# Patient Record
Sex: Male | Born: 1950 | Race: Asian | Hispanic: No | Marital: Married | Smoking: Never smoker
Health system: Southern US, Community
[De-identification: ages and names within clinical notes are randomized; demographics above are authoritative.]

## PROBLEM LIST (undated history)

## (undated) DIAGNOSIS — I10 Essential (primary) hypertension: Secondary | ICD-10-CM

## (undated) DIAGNOSIS — E119 Type 2 diabetes mellitus without complications: Secondary | ICD-10-CM

## (undated) DIAGNOSIS — E7800 Pure hypercholesterolemia, unspecified: Secondary | ICD-10-CM

---

## 2007-09-09 ENCOUNTER — Inpatient Hospital Stay (HOSPITAL_BASED_OUTPATIENT_CLINIC_OR_DEPARTMENT_OTHER): Admission: RE | Admit: 2007-09-09 | Discharge: 2007-09-09 | Payer: Self-pay | Admitting: Cardiology

## 2010-08-23 NOTE — Cardiovascular Report (Signed)
NAMEASIF, MUCHOW                ACCOUNT NO.:  [id number]   MEDICAL RECORD NO.:  [id number]          PATIENT TYPE:  OIB   LOCATION:  1962                         FACILITY:  [facility]   PHYSICIAN:  Mohan N. Sharyn Lull, M.D. DATE OF BIRTH:  1950/10/27   DATE OF PROCEDURE:  09/09/2007  DATE OF DISCHARGE:  09/09/2007                            CARDIAC CATHETERIZATION   PROCEDURE:  Left cardiac catheterization with selective left and right  coronary angiography, left ventriculography via right groin using  Judkins technique.   INDICATIONS FOR PROCEDURE:  Mr. Thedore Mins is a 60 year old Asian male with  past medical history significant for hypertension, non-insulin-dependent  diabetes mellitus, and hypercholesteremia.  Complains of retrosternal  chest pain and pressure with minimal exertion, relieved with rest.  Denies any nausea, vomiting, or diaphoresis.  Denies shortness of  breath.  Denies palpitation, lightheadedness, or syncope.  Denies PND,  orthopnea, or leg swelling.  Denies rest or nocturnal angina.  Denies  any slurred speech or weakness.  Denies claudication or pain in legs.  Denies relation of chest pain to sleep, breathing, or movement.  Denies  any cardiac workup in the past.  EKG done in the office showed mild ST-T  wave changes in the inferior leads with regular sinus rhythm.   PAST SURGICAL HISTORY:  None.   ALLERGIES:  None.   HOME MEDICATIONS:  He is on;  1. Lisinopril 40 mg p.o. daily.  2. Lipitor 40 mg p.o. daily.  3. Metformin 1000 mg p.o. b.i.d.  4. Enteric-coated aspirin 81 mg p.o. daily.   SOCIAL HISTORY:  He is married, two children.  No history of smoking.  Drinks whiskey occasionally, on weekends socially.  Works for [employer].   FAMILY HISTORY:  Father died of accidental death.  He was hypertensive  at the age of 19.  Mother died of MI at the age of 46.  One sister is  diabetic and one brother is diabetic.   PHYSICAL EXAMINATION:  GENERAL:  He  was alert, awake, and oriented x3,  in no acute distress.  VITAL SIGNS:  Blood pressure was 130/82 and pulse was 62 regular.  HEENT:  Conjunctivae was pink.  NECK:  Supple.  No JVD or no bruit.  LUNGS:  Clear to auscultation without rhonchi or rales.  CARDIOVASCULAR:  S1, S2 was normal.  Soft systolic murmur.  ABDOMEN:  Soft.  Bowel sounds are present and nontender.  EXTREMITIES:  There is no clubbing, cyanosis, or edema.   LABORATORY DATA:  Hemoglobin was 14.9, hematocrit 43.9, and white count  was 6.2.  Potassium was 4.5, BUN 19, creatinine 0.97, glucose 129, total  cholesterol 128, LDL 59, HDL 55, and hemoglobin A1c was 5.9.  EKG done  in the office showed normal sinus rhythm with minor ST and T-wave  changes in the inferior leads.   IMPRESSION:  1. Onset angina with minor EKG changes.  2. Rule out coronary insufficiency.  3. Hypertension.  4. Non-insulin-dependent diabetes mellitus.  5. Hypercholesteremia.  6. Positive family history of coronary artery disease.   Discussed with the patient regarding noninvasive  stress testing versus  left catheterization, its risks and benefits, i.e., death, MI, stroke,  need for emergency CABG, local vascular complications, etc., and  consented for the procedure.   PROCEDURE:  After obtaining the informed consent, the patient was  brought to the catheterization lab and was placed on the fluoroscopy  table.  The right groin was prepped and draped in usual fashion.  A 2%  Xylocaine was used for local anesthesia in the right groin, with the  help of thin-wall needle, a 4-French arterial sheath was placed.  The  sheath was aspirated and flushed.  Next, a 6-French left Judkins  catheter was advanced over the wire under fluoroscopic guidance up to  the ascending aorta.  Wire was pulled out, the catheter was aspirated  and connected to the manifold.  Catheter was further advanced and  engaged into left coronary ostium.  Multiple views of the left  system  were taken.  Next, the catheter was disengaged and was pulled out over  the wire and was replaced with 6-French right Judkins catheter, which  was advanced over the wire under fluoroscopic guidance up to the  ascending aorta.  Wire was pulled out, the catheter was aspirated and  connected to the manifold.  Catheter was further advanced and engaged  into right coronary ostium.  Multiple views of the right system were  taken.  Next, the catheter was disengaged and was pulled out over the  wire and was replaced with 4-French pigtail catheter, which was advanced  over the wire under fluoroscopic guidance up to the ascending aorta.  The wire was pulled out, the catheter was aspirated and connected to the  manifold.  Catheter was further advanced across aortic valve into the  LV.  LV pressures were recorded.  Next, LV graft was done in 30-degree  RAO position.  Post angiographic pressures were recorded from LV and  then pullback pressures were recorded from the aorta.  There was no  gradient across the aortic valve.  Next, the pigtail catheter was pulled  out over the wire.  Sheaths were aspirated and flushed.   FINDINGS:  LV showed good LV systolic function and mild LVH.  EF of 55%  to 60%, left main was patent.  LAD has 5% to 10% proximal stenosis.  Diagonal I to diagonal III were very small, which were patent.  Ramus  was very small, which was patent.  Left circumflex was small, which was  patent.  RCA has 5% to 10% mid stenosis.  PDA and PLV branches were  small, which were patent.  The patient tolerated the procedure well.  There were no complications.  The patient was transferred to recovery  room in stable condition.      Eduardo Osier. Sharyn Lull, M.D.  Electronically Signed     [facility]/MEDQ  D:  09/09/2007  T:  09/09/2007  Job:  [id number]   cc:   Coletta Memos, M.D.

## 2019-01-21 ENCOUNTER — Other Ambulatory Visit: Payer: Self-pay | Admitting: Family Medicine

## 2019-01-21 DIAGNOSIS — R109 Unspecified abdominal pain: Secondary | ICD-10-CM

## 2019-01-27 ENCOUNTER — Ambulatory Visit (HOSPITAL_COMMUNITY)
Admission: RE | Admit: 2019-01-27 | Discharge: 2019-01-27 | Disposition: A | Discharge status: Home / Self Care | Payer: 59 | Source: Ambulatory Visit | Attending: Family Medicine | Admitting: Family Medicine

## 2019-01-27 ENCOUNTER — Other Ambulatory Visit: Payer: Self-pay

## 2019-01-27 DIAGNOSIS — R109 Unspecified abdominal pain: Secondary | ICD-10-CM | POA: Insufficient documentation

## 2019-02-10 ENCOUNTER — Other Ambulatory Visit: Payer: Self-pay | Admitting: Family Medicine

## 2019-02-10 ENCOUNTER — Other Ambulatory Visit (HOSPITAL_COMMUNITY): Payer: Self-pay | Admitting: Family Medicine

## 2019-02-10 DIAGNOSIS — R109 Unspecified abdominal pain: Secondary | ICD-10-CM

## 2019-02-20 ENCOUNTER — Ambulatory Visit (HOSPITAL_COMMUNITY): Payer: 59

## 2019-02-28 ENCOUNTER — Other Ambulatory Visit: Payer: Self-pay

## 2019-02-28 ENCOUNTER — Ambulatory Visit (HOSPITAL_COMMUNITY)
Admission: RE | Admit: 2019-02-28 | Discharge: 2019-02-28 | Disposition: A | Discharge status: Home / Self Care | Payer: 59 | Source: Ambulatory Visit | Attending: Family Medicine | Admitting: Family Medicine

## 2019-02-28 DIAGNOSIS — R109 Unspecified abdominal pain: Secondary | ICD-10-CM | POA: Insufficient documentation

## 2019-02-28 MED ORDER — TECHNETIUM TC 99M MEBROFENIN IV KIT
5.2300 | PACK | Freq: Once | INTRAVENOUS | Status: AC | PRN
  Administered 2019-02-28: 07:00:00 5.23 via INTRAVENOUS

## 2020-02-17 ENCOUNTER — Ambulatory Visit
Admission: EM | Admit: 2020-02-17 | Discharge: 2020-02-17 | Disposition: A | Discharge status: Home / Self Care | Payer: PRIVATE HEALTH INSURANCE

## 2020-02-17 DIAGNOSIS — S0990XA Unspecified injury of head, initial encounter: Secondary | ICD-10-CM | POA: Diagnosis not present

## 2020-02-17 DIAGNOSIS — M25511 Pain in right shoulder: Secondary | ICD-10-CM | POA: Diagnosis not present

## 2020-02-17 DIAGNOSIS — M25521 Pain in right elbow: Secondary | ICD-10-CM | POA: Diagnosis not present

## 2020-02-17 HISTORY — DX: Essential (primary) hypertension: I10

## 2020-02-17 HISTORY — DX: Pure hypercholesterolemia, unspecified: E78.00

## 2020-02-17 HISTORY — DX: Type 2 diabetes mellitus without complications: E11.9

## 2020-02-17 NOTE — ED Provider Notes (Signed)
[facility]    CSN: [id number] Arrival date & time: 02/17/20  1659      History   Chief Complaint Chief Complaint  Patient presents with  . Head Injury    HPI Walter Hawkins is a 69 y.o. male  Presenting for evaluation after a metal pipe weighing approximately 60-80 pounds broke off and hit him on the right side of his head causing him to fall onto his right shoulder.  States fall was witnessed: No loss of consciousness, vomiting, change in vision or hearing.  Denies dizziness, irritability, memory issues.  States that he sat down at work for 15 minutes and felt better.  Has not taken thing for this.  Denies anticoagulant or blood thinner use.  Past Medical History:  Diagnosis Date  . Diabetes mellitus without complication (HCC)   . High cholesterol   . Hypertension     There are no problems to display for this patient.   History reviewed. No pertinent surgical history.     Home Medications    Prior to Admission medications   Medication Sig Start Date End Date Taking? Authorizing Provider  lisinopril (ZESTRIL) 20 MG tablet Take 20 mg by mouth daily.   Yes [provider]  metFORMIN (GLUCOPHAGE) 850 MG tablet Take 850 mg by mouth 2 (two) times daily with a meal.   Yes [provider]  rosuvastatin (CRESTOR) 40 MG tablet Take 40 mg by mouth daily.   Yes [provider]    Family History History reviewed. No pertinent family history.  Social History Social History   Tobacco Use  . Smoking status: Never Smoker  . Smokeless tobacco: Never Used  Substance Use Topics  . Alcohol use: Yes  . Drug use: Not Currently     Allergies   Patient has no known allergies.   Review of Systems Review of Systems  Constitutional: Negative for fatigue and fever.  Respiratory: Negative for cough and shortness of breath.   Cardiovascular: Negative for chest pain and palpitations.  Gastrointestinal: Negative for abdominal pain, diarrhea  and vomiting.  Musculoskeletal: Negative for arthralgias and myalgias.       R shoulder & elbow pain  Skin: Negative for rash and wound.  Neurological: Positive for headaches. Negative for dizziness, tremors, seizures, syncope, facial asymmetry, speech difficulty, weakness, light-headedness and numbness.  Psychiatric/Behavioral: Negative for agitation, behavioral problems and confusion.  All other systems reviewed and are negative.    Physical Exam Triage Vital Signs ED Triage Vitals  Enc Vitals Group     BP 02/17/20 1825 (!) 179/84     Pulse Rate 02/17/20 1825 80     Resp 02/17/20 1825 18     Temp 02/17/20 1825 98.1 F (36.7 C)     Temp Source 02/17/20 1825 Oral     SpO2 02/17/20 1825 97 %     Weight --      Height --      Head Circumference --      Peak Flow --      Pain Score 02/17/20 1836 7     Pain Loc --      Pain Edu? --      Excl. in GC? --    No data found.  Updated Vital Signs BP (!) 179/84 (BP Location: Left Arm)   Pulse 80   Temp 98.1 F (36.7 C) (Oral)   Resp 18   SpO2 97%   Visual Acuity Right Eye Distance:   Left Eye Distance:  Bilateral Distance:    Right Eye Near:   Left Eye Near:    Bilateral Near:     Physical Exam Constitutional:      General: He is not in acute distress. HENT:     Head: Normocephalic and atraumatic.     Comments: No not to right side of head (area of impact)    Right Ear: Tympanic membrane, ear canal and external ear normal.     Left Ear: Tympanic membrane, ear canal and external ear normal.     Mouth/Throat:     Mouth: Mucous membranes are moist.     Pharynx: Oropharynx is clear.  Eyes:     General: No scleral icterus.    Extraocular Movements: Extraocular movements intact.     Conjunctiva/sclera: Conjunctivae normal.     Pupils: Pupils are equal, round, and reactive to light.  Cardiovascular:     Rate and Rhythm: Normal rate.  Pulmonary:     Effort: Pulmonary effort is normal. No respiratory distress.      Breath sounds: No wheezing.  Musculoskeletal:        General: Tenderness present. No swelling or deformity.     Cervical back: Normal range of motion. No rigidity or tenderness.     Comments: Mild right deltoid tenderness that spares bony processes of shoulder.  No scapular tenderness, bony elbow tenderness or swelling, bruising.  NVI  Lymphadenopathy:     Cervical: No cervical adenopathy.  Skin:    Capillary Refill: Capillary refill takes less than 2 seconds.     Coloration: Skin is not jaundiced.     Findings: No bruising or rash.  Neurological:     General: No focal deficit present.     Mental Status: He is alert and oriented to person, place, and time.     Cranial Nerves: Cranial nerves are intact. No cranial nerve deficit.     Sensory: Sensation is intact. No sensory deficit.     Motor: Motor function is intact. No weakness.     Coordination: Coordination is intact. Coordination normal.     Gait: Gait is intact. Gait normal.     Deep Tendon Reflexes: Reflexes normal.  Psychiatric:        Mood and Affect: Mood normal.        Behavior: Behavior normal.      UC Treatments / Results  Labs (all labs ordered are listed, but only abnormal results are displayed) Labs Reviewed - No data to display  EKG   Radiology No results found.  Procedures Procedures (including critical care time)  Medications Ordered in UC Medications - No data to display  Initial Impression / Assessment and Plan / UC Course  I have reviewed the triage vital signs and the nursing notes.  Pertinent labs & imaging results that were available during my care of the patient were reviewed by me and considered in my medical decision making (see chart for details).     Patient appears well in office.  Reassuring musculoskeletal exam and no neurocognitive deficit.  Not on anticoagulant or blood thinner.  Denies loss of consciousness.  Will treat supportively as below.  ER return precautions discussed, pt  verbalized understanding and is agreeable to plan. Final Clinical Impressions(s) / UC Diagnoses   Final diagnoses:  Acute pain of right shoulder  Right elbow pain  Head injury, initial encounter     Discharge Instructions     RICE: rest, ice, compression, elevation as needed for pain.  Pain medication:  350 mg-1000 mg of Tylenol (acetaminophen) and/or 200 mg - 800 mg of Advil (ibuprofen, Motrin) every 8 hours as needed.  May alternate between the two throughout the day as they are generally safe to take together.  DO NOT exceed more than 3000 mg of Tylenol or 3200 mg of ibuprofen in a 24 hour period as this could damage your stomach, kidneys, liver, or increase your bleeding risk.  Important to follow up with specialist(s) below for further evaluation/management if your symptoms persist or worsen.    ED Prescriptions    None     PDMP not reviewed this encounter.   Hall-Potvin, [place], [place] 02/17/20 2032

## 2020-02-17 NOTE — Discharge Instructions (Addendum)

## 2020-02-17 NOTE — ED Triage Notes (Signed)
Pt states at work a metal rod (60-80lbs) broke off and fell hitting him on rt side/top of head/rt shoulder/rt arm. States it knocked him to the ground and not sure what happen for 2-3secs. Pt c/o headache, rt shoulder and rt elbow pain . Pt denies blurred vision or nausea at this time. Pt a/ox4. No distress noted.

## 2020-02-23 ENCOUNTER — Ambulatory Visit: Payer: Self-pay

## 2020-02-23 ENCOUNTER — Other Ambulatory Visit: Payer: Self-pay | Admitting: Occupational Medicine

## 2020-02-23 ENCOUNTER — Other Ambulatory Visit: Payer: Self-pay

## 2020-02-23 ENCOUNTER — Ambulatory Visit: Admission: EM | Admit: 2020-02-23 | Discharge: 2020-02-23 | Discharge status: Against Medical Advice | Payer: Self-pay

## 2020-02-23 DIAGNOSIS — M25511 Pain in right shoulder: Secondary | ICD-10-CM

## 2020-10-04 IMAGING — US US ABDOMEN LIMITED
1 series · 14 of 25 positions shown · non-contrast
Comparison: None.

CLINICAL DATA: Right upper quadrant discomfort for 1 year.

EXAM:
ULTRASOUND ABDOMEN LIMITED RIGHT UPPER QUADRANT

[Series 1: us abdomen limited · 14 of 42 slices shown]
[im 1/42]
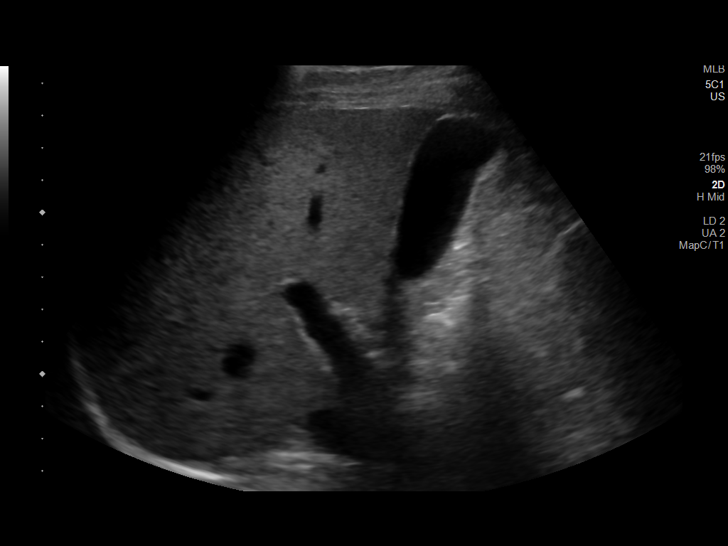
[im 4/42]
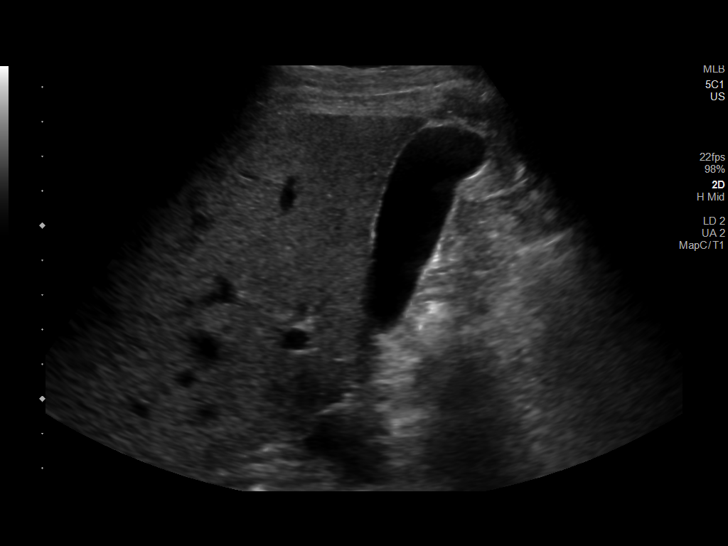
[im 7/42]
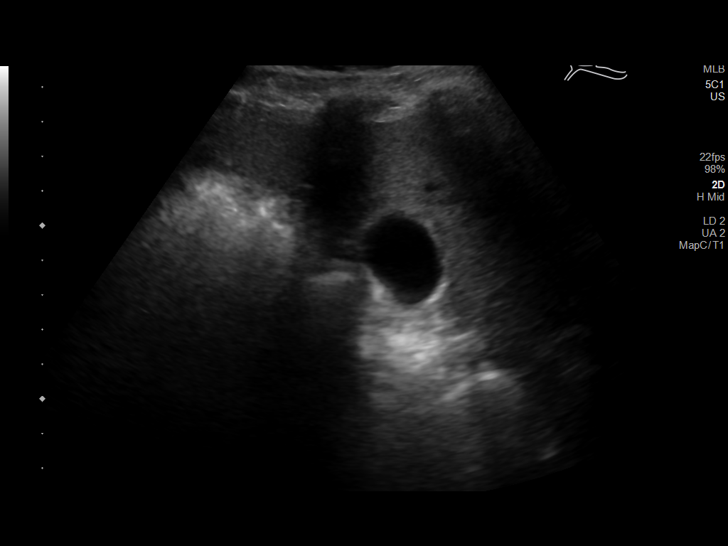
[im 11/42]
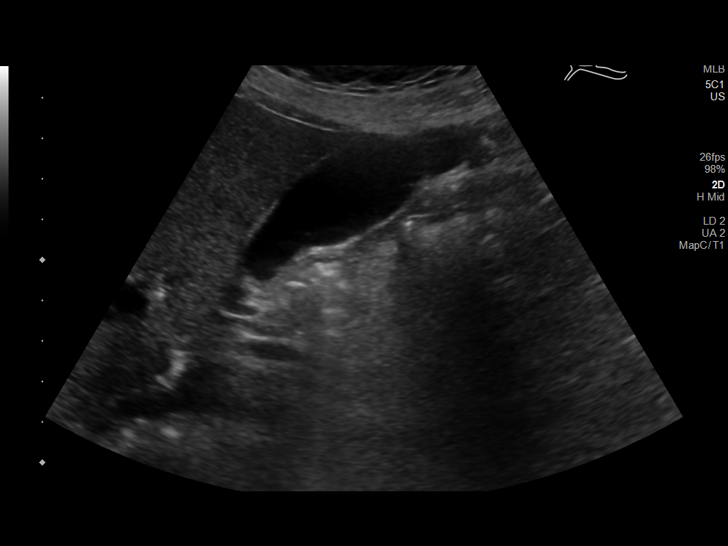
[im 14/42]
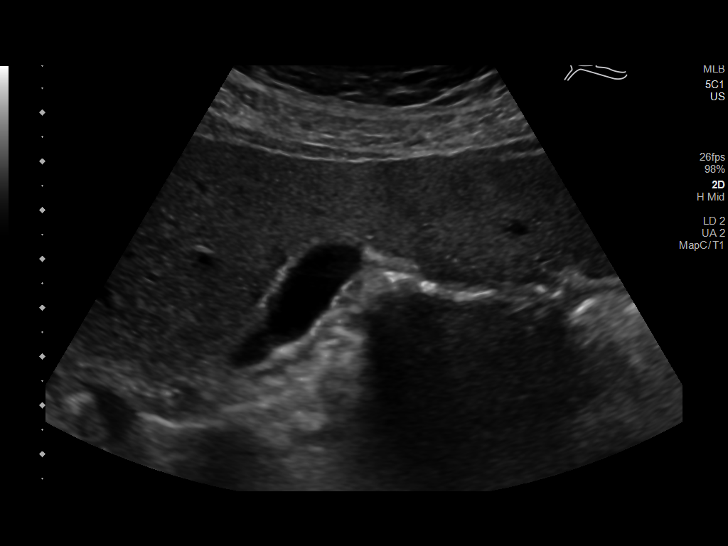
[im 16/42]
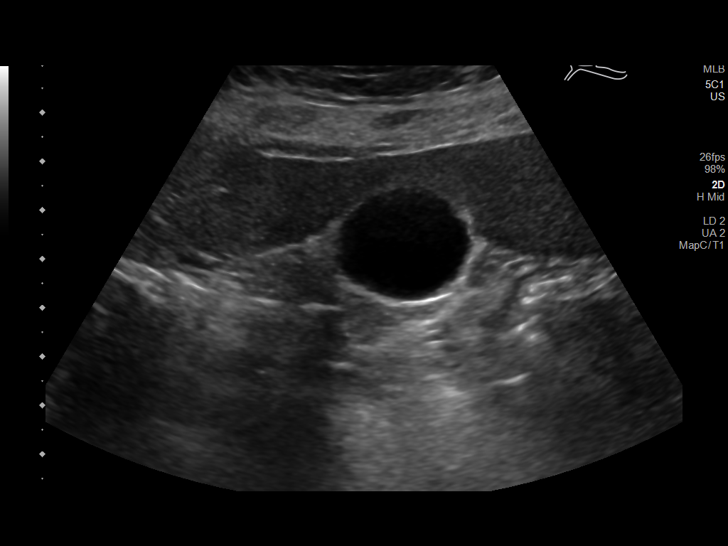
[im 19/42]
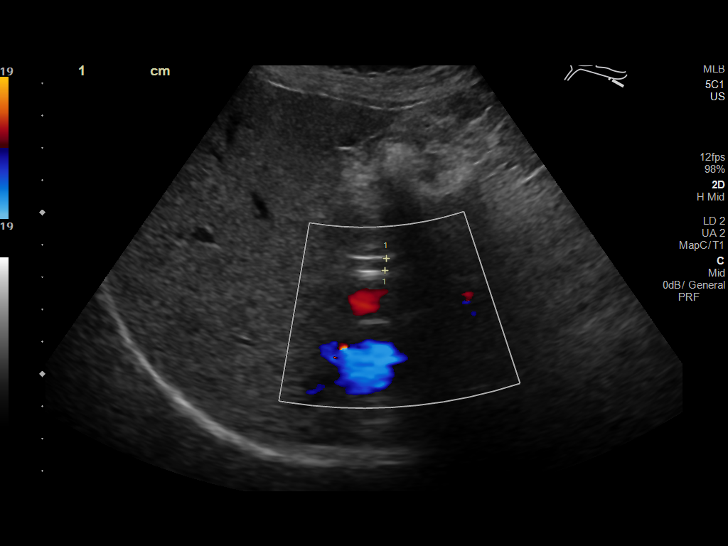
[im 23/42]
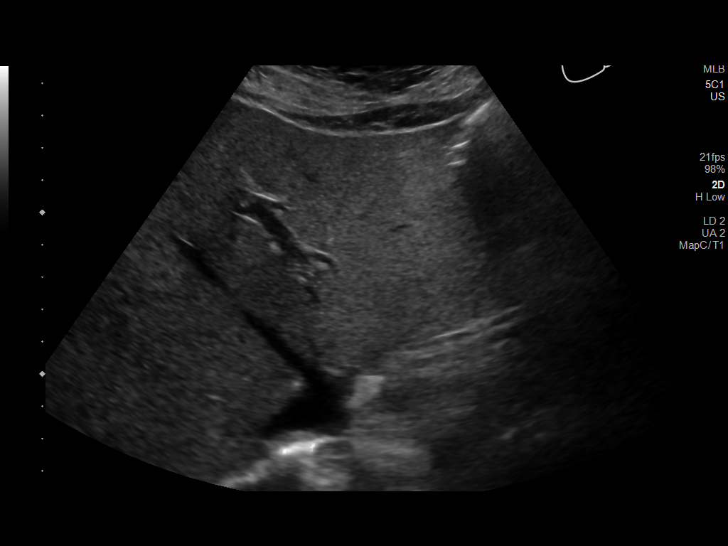
[im 26/42]
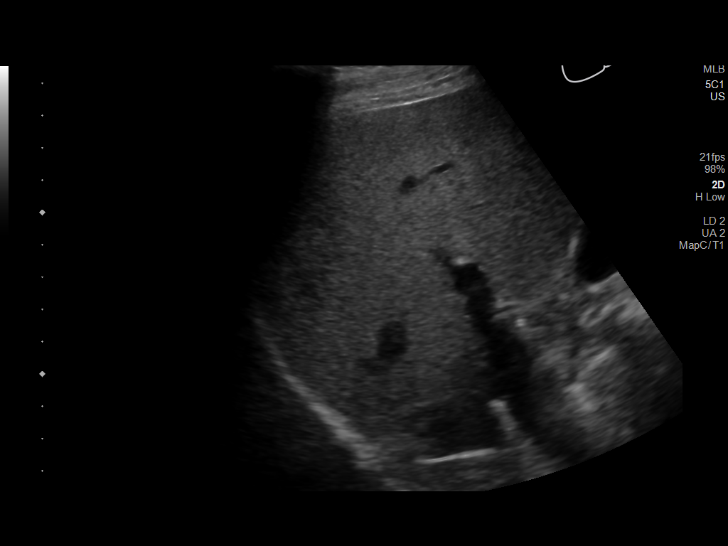
[im 28/42]
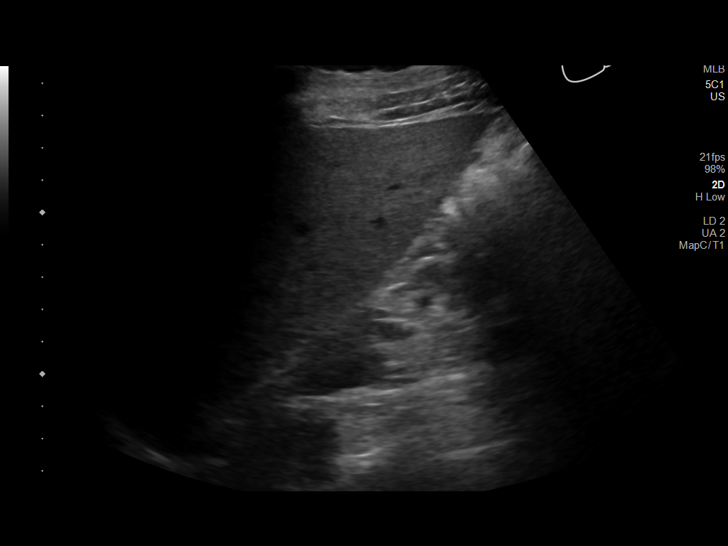
[im 31/42]
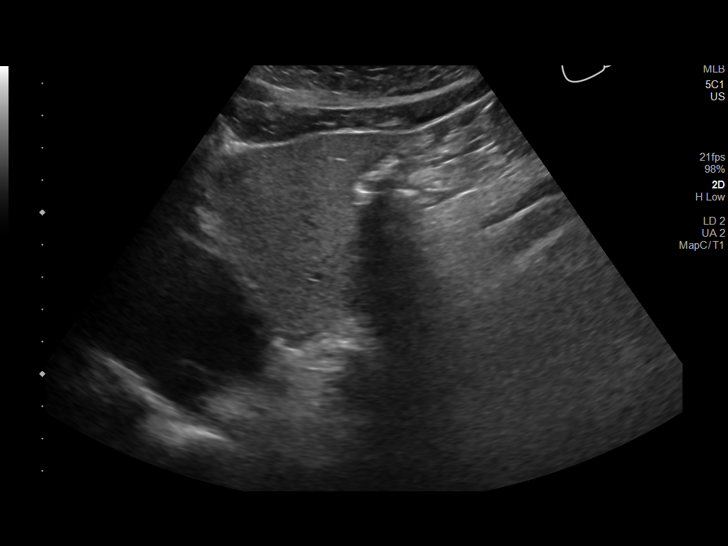
[im 35/42]
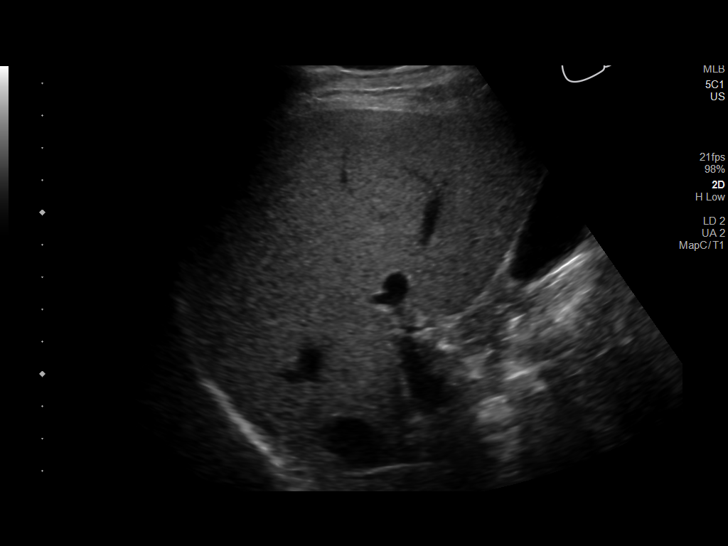
[im 38/42]
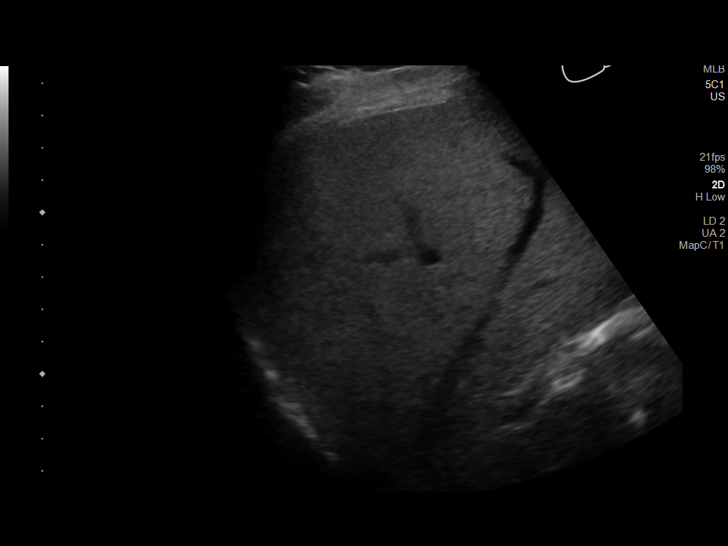
[im 42/42]
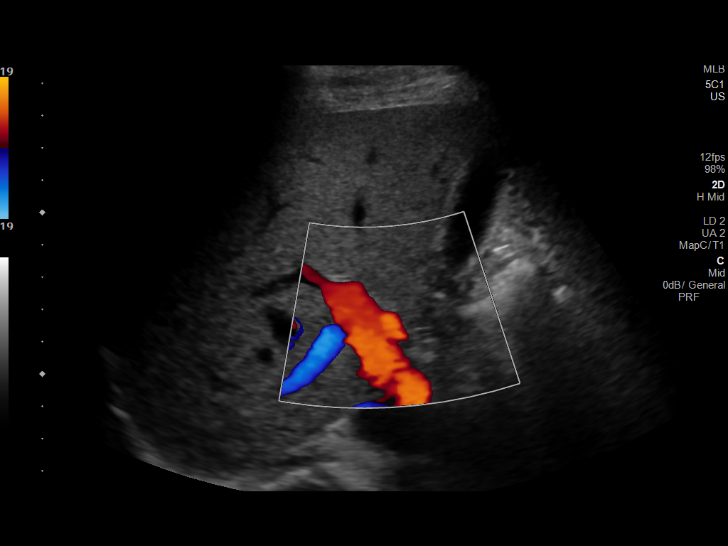

[14 of 25 positions shown; findings below may reference images not displayed]

FINDINGS: Gallbladder:

No gallstones or wall thickening visualized. No sonographic Murphy
sign noted by sonographer.

Common bile duct:

Diameter: 0.4 cm

Liver:

No focal lesion identified. The liver echogenicity is diffusely
increased. Portal vein is patent on color Doppler imaging with
normal direction of blood flow towards the liver.

Other: None.
IMPRESSION: Diffusely increased liver echogenicity as can be seen with hepatic
steatosis.

## 2020-11-05 IMAGING — NM NM HEPATO W/GB/PHARM/[PERSON_NAME]
2 series · 12 of 12 positions shown · non-contrast
Comparison: None

CLINICAL DATA: Abdominal pain for 1 year

EXAM:
NUCLEAR MEDICINE HEPATOBILIARY IMAGING WITH GALLBLADDER EF
TECHNIQUE: Sequential images of the abdomen were obtained [DATE] minutes
following intravenous administration of radiopharmaceutical. After
oral ingestion of Ensure, gallbladder ejection fraction was
determined. At 60 min, normal ejection fraction is greater than 33%.
RADIOPHARMACEUTICALS:  5.23 mCi 3c-RRm  Choletec IV

[he hepatobiliary · 4.52mm/px · 6 of 60 frames shown (1 of 2)]
[frame 6/60]
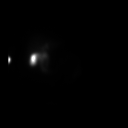
[frame 16/60]
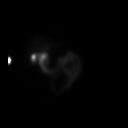
[frame 26/60]
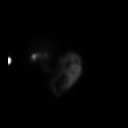
[frame 36/60]
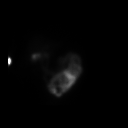
[frame 46/60]
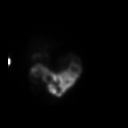
[frame 56/60]
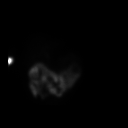

[he hepatobiliary · 4.52mm/px · 6 of 60 frames shown (2 of 2)]
[frame 6/60]
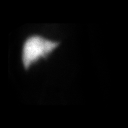
[frame 16/60]
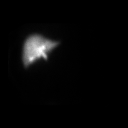
[frame 26/60]
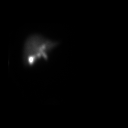
[frame 36/60]
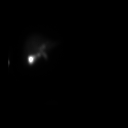
[frame 46/60]
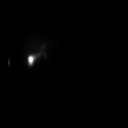
[frame 56/60]
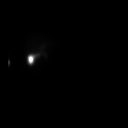

[12 of 12 positions shown; findings below may reference images not displayed]

FINDINGS: Normal tracer extraction from bloodstream indicating normal
hepatocellular function.

Normal excretion of tracer into biliary tree.

Gallbladder visualized at 17 min.

Small bowel not visualized until following fatty meal stimulation

No hepatic retention of tracer.

Subjectively normal emptying of tracer from gallbladder following
fatty meal stimulation.

Calculated gallbladder ejection fraction is 99%, normal.

Patient reported no symptoms following Ensure ingestion.

Normal gallbladder ejection fraction following Ensure ingestion is
greater than 33% at 1 hour.
IMPRESSION: Patent biliary tree with normal gallbladder ejection fraction of 99%
following fatty meal stimulation.

## 2021-01-07 ENCOUNTER — Other Ambulatory Visit: Payer: Self-pay

## 2021-01-07 ENCOUNTER — Emergency Department (HOSPITAL_COMMUNITY)
Admission: EM | Admit: 2021-01-07 | Discharge: 2021-01-08 | Disposition: A | Discharge status: Home / Self Care | Payer: Medicare (Managed Care) | Attending: Emergency Medicine | Admitting: Emergency Medicine

## 2021-01-07 ENCOUNTER — Emergency Department (HOSPITAL_COMMUNITY): Payer: Medicare (Managed Care)

## 2021-01-07 DIAGNOSIS — E119 Type 2 diabetes mellitus without complications: Secondary | ICD-10-CM | POA: Diagnosis not present

## 2021-01-07 DIAGNOSIS — R030 Elevated blood-pressure reading, without diagnosis of hypertension: Secondary | ICD-10-CM | POA: Diagnosis present

## 2021-01-07 DIAGNOSIS — R9431 Abnormal electrocardiogram [ECG] [EKG]: Secondary | ICD-10-CM | POA: Insufficient documentation

## 2021-01-07 DIAGNOSIS — Z79899 Other long term (current) drug therapy: Secondary | ICD-10-CM | POA: Diagnosis not present

## 2021-01-07 DIAGNOSIS — I10 Essential (primary) hypertension: Secondary | ICD-10-CM | POA: Diagnosis not present

## 2021-01-07 DIAGNOSIS — R52 Pain, unspecified: Secondary | ICD-10-CM

## 2021-01-07 DIAGNOSIS — Z7984 Long term (current) use of oral hypoglycemic drugs: Secondary | ICD-10-CM | POA: Diagnosis not present

## 2021-01-07 LAB — CBC
HCT: 40.1 % (ref 39.0–52.0)
Hemoglobin: 13.4 g/dL (ref 13.0–17.0)
MCH: 30.8 pg (ref 26.0–34.0)
MCHC: 33.4 g/dL (ref 30.0–36.0)
MCV: 92.2 fL (ref 80.0–100.0)
Platelets: 270 10*3/uL (ref 150–400)
RBC: 4.35 MIL/uL (ref 4.22–5.81)
RDW: 12.9 % (ref 11.5–15.5)
WBC: 7.5 10*3/uL (ref 4.0–10.5)
nRBC: 0 % (ref 0.0–0.2)

## 2021-01-07 LAB — BASIC METABOLIC PANEL
Anion gap: 9 (ref 5–15)
BUN: 17 mg/dL (ref 8–23)
CO2: 24 mmol/L (ref 22–32)
Calcium: 9.2 mg/dL (ref 8.9–10.3)
Chloride: 102 mmol/L (ref 98–111)
Creatinine, Ser: 1.02 mg/dL (ref 0.61–1.24)
GFR, Estimated: >60 mL/min (ref >60)
Glucose, Bld: 213 mg/dL — ABNORMAL HIGH (ref 70–99)
Potassium: 4.3 mmol/L (ref 3.5–5.1)
Sodium: 135 mmol/L (ref 135–145)

## 2021-01-07 LAB — TROPONIN I (HIGH SENSITIVITY)
Troponin I (High Sensitivity): 7 ng/L (ref <18)
Troponin I (High Sensitivity): 6 ng/L (ref <18)

## 2021-01-07 NOTE — ED Provider Notes (Signed)
Emergency Medicine Provider Triage Evaluation Note  Walter Hawkins , a 70 y.o. male  was evaluated in triage.  Pt complains of abnormal ekg. Pt seen by pcp for regular checkup and had and ekg which showed ischemic changes. The patient is completely asymptomatic  Review of Systems  Positive: Abnormal ekg Negative: Chest pain, sob  Physical Exam  BP (!) 200/69 (BP Location: Right Arm)   Pulse 75   Temp 98.8 F (37.1 C) (Oral)   Resp 16   SpO2 100%  Gen:   Awake, no distress   Resp:  Normal effort  MSK:   Moves extremities without difficulty   Medical Decision Making  Medically screening exam initiated at 6:56 PM.  Appropriate orders placed.  Walter Hawkins was informed that the remainder of the evaluation will be completed by another provider, this initial triage assessment does not replace that evaluation, and the importance of remaining in the ED until their evaluation is complete.     Walter Meres, PA-C 01/07/21 1856    Walter Norfolk, DO 01/07/21 2032

## 2021-01-07 NOTE — ED Triage Notes (Signed)
Pt here POV d/t pt having abnormal EKG from PCP. Pt denies CP,SOB, no nausea or vomiting. Hypertensive in triage 200/69.

## 2021-01-08 NOTE — ED Provider Notes (Signed)
[facility] EMERGENCY DEPARTMENT Provider Note   CSN: [id number] Arrival date & time: 01/07/21  1850     History Chief Complaint  Patient presents with   Abnormal ECG    Walter Hawkins is a 70 y.o. male.  HPI 71 year old male presents with an abnormal ECG.  History is from patient and son.  He went to his PCPs office today for a regular checkup as he gets checked every 6 months for blood pressure.  They did an ECG for an unknown reason as he was not having chest pain and it was abnormal and so he was sent here.  He has not had any recent chest pain or current chest pain.  No headaches, trouble breathing, vision changes, weakness/numbness.  He has been taking his blood pressure meds but typically takes them at night and because he was here he has not taken it yet.  He feels fine.  Past Medical History:  Diagnosis Date   Diabetes mellitus without complication (HCC)    High cholesterol    Hypertension     There are no problems to display for this patient.   No past surgical history on file.     No family history on file.  Social History   Tobacco Use   Smoking status: Never   Smokeless tobacco: Never  Substance Use Topics   Alcohol use: Yes   Drug use: Not Currently    Home Medications Prior to Admission medications   Medication Sig Start Date End Date Taking? Authorizing Provider  lisinopril (ZESTRIL) 20 MG tablet Take 20 mg by mouth daily.    [provider]  metFORMIN (GLUCOPHAGE) 850 MG tablet Take 850 mg by mouth 2 (two) times daily with a meal.    [provider]  rosuvastatin (CRESTOR) 40 MG tablet Take 40 mg by mouth daily.    [provider]    Allergies    Patient has no known allergies.  Review of Systems   Review of Systems  Eyes:  Negative for visual disturbance.  Respiratory:  Negative for shortness of breath.   Cardiovascular:  Negative for chest pain.  Neurological:  Negative for weakness, numbness  and headaches.  All other systems reviewed and are negative.  Physical Exam Updated Vital Signs BP (!) 142/97 (BP Location: Right Arm)   Pulse 65   Temp 98.3 F (36.8 C) (Oral)   Resp 20   Ht 5\' 6"  (1.676 m)   Wt 67.1 kg   SpO2 97%   BMI 23.89 kg/m   Physical Exam Vitals and nursing note reviewed.  Constitutional:      Appearance: He is well-developed.  HENT:     Head: Normocephalic and atraumatic.     Right Ear: External ear normal.     Left Ear: External ear normal.     Nose: Nose normal.  Eyes:     General:        Right eye: No discharge.        Left eye: No discharge.     Pupils: Pupils are equal, round, and reactive to light.  Cardiovascular:     Rate and Rhythm: Normal rate and regular rhythm.     Heart sounds: Normal heart sounds.  Pulmonary:     Effort: Pulmonary effort is normal.     Breath sounds: Normal breath sounds.  Abdominal:     General: There is no distension.     Palpations: Abdomen is soft.     Tenderness:  There is no abdominal tenderness.  Musculoskeletal:     Cervical back: Neck supple.  Skin:    General: Skin is warm and dry.  Neurological:     Mental Status: He is alert.     Comments: 5/5 strength in all 4 extremities. No slurred speech  Psychiatric:        Mood and Affect: Mood is not anxious.    ED Results / Procedures / Treatments   Labs (all labs ordered are listed, but only abnormal results are displayed) Labs Reviewed  BASIC METABOLIC PANEL - Abnormal; Notable for the following components:      Result Value   Glucose, Bld 213 (*)    All other components within normal limits  CBC  TROPONIN I (HIGH SENSITIVITY)  TROPONIN I (HIGH SENSITIVITY)    EKG EKG Interpretation  Date/Time:  Friday January 07 2021 18:54:05 EDT Ventricular Rate:  78 PR Interval:  144 QRS Duration: 104 QT Interval:  362 QTC Calculation: 412 R Axis:   82 Text Interpretation: Normal sinus rhythm nonspecific ST/T changes No old tracing to compare  Confirmed by Pricilla Loveless [phone]) on 01/07/2021 11:51:31 PM  Radiology DG Chest 2 View  Result Date: 01/07/2021 CLINICAL DATA:  Abnormal EKG. EXAM: CHEST - 2 VIEW COMPARISON:  None. FINDINGS: Very mild atelectasis is noted within the bilateral lung bases. There is no evidence of acute infiltrate, pleural effusion or pneumothorax. The heart size and mediastinal contours are within normal limits. The visualized skeletal structures are unremarkable. IMPRESSION: No active cardiopulmonary disease. Electronically Signed   By: Aram Candela M.D.   On: 01/07/2021 19:15    Procedures Procedures   Medications Ordered in ED Medications - No data to display  ED Course  I have reviewed the triage vital signs and the nursing notes.  Pertinent labs & imaging results that were available during my care of the patient were reviewed by me and considered in my medical decision making (see chart for details).    MDM Rules/Calculators/A&P                           No old ECGs to compare to except the one that was sent with him.  He states he has not had an ECG at this doctor's office before.  His ECG is not normal but I suspect this is more chronic changes, probably from hypertension than anything else.  Labs done from triage are all benign.  Troponins negative x2.  Given he is asymptomatic he appears stable for discharge.  He is hypertensive but will go home and take his own BP meds.  Given return precautions. Final Clinical Impression(s) / ED Diagnoses Final diagnoses:  Hypertension, unspecified type    Rx / DC Orders ED Discharge Orders     None        Pricilla Loveless, MD 01/08/21 [phone]

## 2021-01-08 NOTE — Discharge Instructions (Signed)
If you develop chest pain, shortness of breath, dizziness, severe headache, numbness or weakness, or any other new/concerning symptoms then return to the ER for evaluation.  Otherwise, call your doctor on Monday to set up outpatient blood pressure medicine re-evaluation

## 2021-02-24 DIAGNOSIS — I517 Cardiomegaly: Secondary | ICD-10-CM | POA: Diagnosis not present

## 2021-02-24 DIAGNOSIS — I10 Essential (primary) hypertension: Secondary | ICD-10-CM

## 2021-07-01 ENCOUNTER — Encounter: Payer: Self-pay | Admitting: Neurology

## 2021-12-23 ENCOUNTER — Ambulatory Visit: Payer: Medicare (Managed Care) | Admitting: Neurology

## 2023-04-30 ENCOUNTER — Other Ambulatory Visit (HOSPITAL_COMMUNITY): Payer: Self-pay | Admitting: Family Medicine

## 2023-04-30 DIAGNOSIS — M25561 Pain in right knee: Secondary | ICD-10-CM

## 2023-05-01 ENCOUNTER — Ambulatory Visit (HOSPITAL_COMMUNITY)
Admission: RE | Admit: 2023-05-01 | Discharge: 2023-05-01 | Disposition: A | Discharge status: Home / Self Care | Payer: Medicare HMO | Source: Ambulatory Visit | Attending: Family Medicine | Admitting: Family Medicine

## 2023-05-01 DIAGNOSIS — M25561 Pain in right knee: Secondary | ICD-10-CM | POA: Insufficient documentation
